# Patient Record
Sex: Female | Born: 1970 | Race: White | Hispanic: No | Marital: Married
Health system: Southern US, Community
[De-identification: ages and names within clinical notes are randomized; demographics above are authoritative.]

## PROBLEM LIST (undated history)

## (undated) DIAGNOSIS — I10 Essential (primary) hypertension: Secondary | ICD-10-CM

## (undated) DIAGNOSIS — E119 Type 2 diabetes mellitus without complications: Secondary | ICD-10-CM

---

## 1999-03-28 ENCOUNTER — Emergency Department (HOSPITAL_COMMUNITY): Admission: EM | Admit: 1999-03-28 | Discharge: 1999-03-28 | Payer: Self-pay | Admitting: Emergency Medicine

## 2001-03-02 ENCOUNTER — Encounter: Payer: Self-pay | Admitting: Family Medicine

## 2001-03-02 ENCOUNTER — Ambulatory Visit (HOSPITAL_COMMUNITY): Admission: RE | Admit: 2001-03-02 | Discharge: 2001-03-02 | Payer: Self-pay | Admitting: Family Medicine

## 2002-04-13 ENCOUNTER — Encounter: Payer: Self-pay | Admitting: Family Medicine

## 2002-04-13 ENCOUNTER — Encounter: Admission: RE | Admit: 2002-04-13 | Discharge: 2002-04-13 | Payer: Self-pay | Admitting: Family Medicine

## 2002-05-04 ENCOUNTER — Encounter: Payer: Self-pay | Admitting: General Surgery

## 2002-05-04 ENCOUNTER — Ambulatory Visit (HOSPITAL_COMMUNITY): Admission: RE | Admit: 2002-05-04 | Discharge: 2002-05-05 | Payer: Self-pay | Admitting: General Surgery

## 2002-12-07 ENCOUNTER — Other Ambulatory Visit: Admission: RE | Admit: 2002-12-07 | Discharge: 2002-12-07 | Payer: Self-pay | Admitting: Obstetrics and Gynecology

## 2002-12-11 ENCOUNTER — Encounter: Admission: RE | Admit: 2002-12-11 | Discharge: 2002-12-11 | Payer: Self-pay | Admitting: Obstetrics and Gynecology

## 2002-12-11 ENCOUNTER — Encounter: Payer: Self-pay | Admitting: Obstetrics and Gynecology

## 2002-12-15 ENCOUNTER — Encounter: Payer: Self-pay | Admitting: Obstetrics and Gynecology

## 2002-12-15 ENCOUNTER — Ambulatory Visit (HOSPITAL_COMMUNITY): Admission: RE | Admit: 2002-12-15 | Discharge: 2002-12-15 | Payer: Self-pay | Admitting: Obstetrics and Gynecology

## 2021-08-01 ENCOUNTER — Emergency Department (HOSPITAL_BASED_OUTPATIENT_CLINIC_OR_DEPARTMENT_OTHER)
Admission: EM | Admit: 2021-08-01 | Discharge: 2021-08-01 | Disposition: A | Discharge status: Home / Self Care | Payer: BC Managed Care – PPO | Attending: Emergency Medicine | Admitting: Emergency Medicine

## 2021-08-01 ENCOUNTER — Other Ambulatory Visit: Payer: Self-pay

## 2021-08-01 ENCOUNTER — Emergency Department (HOSPITAL_BASED_OUTPATIENT_CLINIC_OR_DEPARTMENT_OTHER): Payer: BC Managed Care – PPO

## 2021-08-01 ENCOUNTER — Encounter (HOSPITAL_BASED_OUTPATIENT_CLINIC_OR_DEPARTMENT_OTHER): Payer: Self-pay | Admitting: Emergency Medicine

## 2021-08-01 DIAGNOSIS — S93401A Sprain of unspecified ligament of right ankle, initial encounter: Secondary | ICD-10-CM

## 2021-08-01 DIAGNOSIS — I10 Essential (primary) hypertension: Secondary | ICD-10-CM | POA: Insufficient documentation

## 2021-08-01 DIAGNOSIS — E119 Type 2 diabetes mellitus without complications: Secondary | ICD-10-CM | POA: Diagnosis not present

## 2021-08-01 DIAGNOSIS — W010XXA Fall on same level from slipping, tripping and stumbling without subsequent striking against object, initial encounter: Secondary | ICD-10-CM | POA: Insufficient documentation

## 2021-08-01 DIAGNOSIS — S99911A Unspecified injury of right ankle, initial encounter: Secondary | ICD-10-CM | POA: Diagnosis present

## 2021-08-01 HISTORY — DX: Essential (primary) hypertension: I10

## 2021-08-01 HISTORY — DX: Type 2 diabetes mellitus without complications: E11.9

## 2021-08-01 NOTE — ED Notes (Signed)
PT placed in gown. Pt provided with ice to her left ankle.

## 2021-08-01 NOTE — ED Triage Notes (Signed)
Pt presents to ED POV. Pt c/o R ankle pain s/p mechanical fall. Pt reports that she slipped while trying to get into car. Landed on R knee. Pt denies head injury, denies blood thinners.

## 2021-08-01 NOTE — ED Provider Notes (Signed)
[facility] EMERGENCY DEPARTMENT Provider Note   CSN: [id number] Arrival date & time: 08/01/21  1509     History Chief Complaint  Patient presents with   Ankle Pain    Jenna Acevedo is a 50 y.o. female with medical history significant for hypertension and diabetes.  Patient states that today she was stepping out of her [facility] when she slipped on the step bar and had a mechanical fall to the ground.  Patient now complaining of right ankle pain.  Patient denies hitting her head or being on blood thinners.  Patient states that pain is consistent and describes it as a dull ache.  Patient has not attempted to relieve her symptoms utilizing any over-the-counter medication.  Patient states that when she tries to invert or evert her foot the pain is worsened.  Patient endorses right ankle pain.  Patient denies numbness, tingling to right ankle.   Ankle Pain     Past Medical History:  Diagnosis Date   Diabetes (HCC)    Hypertension     There are no problems to display for this patient.    OB History   No obstetric history on file.     No family history on file.     Home Medications Prior to Admission medications   Not on File    Allergies    Morphine and Amoxicillin  Review of Systems   Review of Systems  Musculoskeletal:  Positive for gait problem and myalgias.  Neurological:  Negative for weakness and numbness.  All other systems reviewed and are negative.  Physical Exam Updated Vital Signs BP (!) 149/100 (BP Location: Right Arm)    Pulse 75    Temp 97.9 F (36.6 C) (Oral)    Resp 18    Ht 5\' 5"  (1.651 m)    Wt 104.3 kg    SpO2 99%    BMI 38.27 kg/m   Physical Exam Vitals and nursing note reviewed.  Constitutional:      General: She is not in acute distress.    Appearance: She is not ill-appearing or toxic-appearing.  HENT:     Head: Normocephalic.  Eyes:     Pupils: Pupils are equal, round, and reactive to light.  Cardiovascular:      Rate and Rhythm: Normal rate and regular rhythm.     Pulses:          Dorsalis pedis pulses are 2+ on the right side and 2+ on the left side.  Pulmonary:     Effort: Pulmonary effort is normal.     Breath sounds: Normal breath sounds. No wheezing.  Abdominal:     General: Abdomen is flat.     Palpations: Abdomen is soft.     Tenderness: There is no abdominal tenderness.  Musculoskeletal:     Cervical back: Normal range of motion.     Right ankle: No swelling, deformity or ecchymosis. Tenderness present over the ATF ligament. Decreased range of motion (Decreased range of motion when patient actively tries to range ankle.).     Left ankle: Normal.     Comments: Right ankle lateral tenderness.  No swelling or ecchymosis.  No deformity.  Patient has 2+ palpable DP pulse.  Skin:    General: Skin is warm and dry.     Capillary Refill: Capillary refill takes less than 2 seconds.  Neurological:     General: No focal deficit present.     Mental Status: She is alert and oriented  to person, place, and time.    ED Results / Procedures / Treatments   Labs (all labs ordered are listed, but only abnormal results are displayed) Labs Reviewed - No data to display  EKG None  Radiology DG Ankle Complete Right  Result Date: 08/01/2021 CLINICAL DATA:  Right ankle pain after a fall, initial encounter. EXAM: RIGHT ANKLE - COMPLETE 3+ VIEW COMPARISON:  None. FINDINGS: No acute osseous or joint abnormality. Probable accessory ossicle along the posterior aspect of the ankle joint. Calcaneal spur. IMPRESSION: No acute osseous abnormality. Electronically Signed   By: Leanna Battles M.D.   On: 08/01/2021 16:03    Procedures Procedures   Medications Ordered in ED Medications - No data to display  ED Course  I have reviewed the triage vital signs and the nursing notes.  Pertinent labs & imaging results that were available during my care of the patient were reviewed by me and considered in my medical  decision making (see chart for details).    MDM Rules/Calculators/A&P                          50 year old female with history of hypertension diabetes presents status post mechanical fall to ground.  Patient complaining of right ankle pain.  Patient has full passive range of motion to right ankle.  No crepitus or deformity noted.  Patient states there is pain with inversion and eversion of ankle.  Patient has 5 out of 5 strength to right foot but complains of tenderness on palpation to lateral side of right ankle.  Patient ambulates with slight limp.  Ankle radiographs are unrevealing of any fractures or abnormalities.  Patient most likely has lateral ankle sprain.  I have educated this patient on supportive measures to alleviate her pain such as ice, heat, NSAIDs.  I will have the nurse apply an Ace wrap for support prior to discharge.  I have educated this patient and provided her with return precautions including numbness and tingling to her ankle or foot.  I have advised the patient if pain persist after 7 days to present to PCP for further management and work-up.  The patient had all of her questions answered.  She is in agreement with the plan for discharge.  The patient is stable on discharge  Final Clinical Impression(s) / ED Diagnoses Final diagnoses:  Sprain of right ankle, unspecified ligament, initial encounter    Rx / DC Orders ED Discharge Orders     None        Al Decant, PA-C 08/01/21 1756    Charlynne Pander, MD 08/01/21 2203

## 2021-08-01 NOTE — ED Notes (Signed)
Patient transported to X-ray 

## 2021-08-01 NOTE — Discharge Instructions (Addendum)
Return to ED with any new or worsening symptoms such as numbness, tingling to right ankle If pain persist in 7 days follow-up with your PCP for ongoing management Utilize the RICE method.  Rest, ice, compression, elevation. Utilize ice for the first 24 hours and heat after 24 hours for pain control You may take ibuprofen for pain and swelling

## 2021-08-01 NOTE — ED Notes (Signed)
Wrapped pt right ankle with ace wrap.

## 2022-12-01 ENCOUNTER — Encounter (INDEPENDENT_AMBULATORY_CARE_PROVIDER_SITE_OTHER): Payer: BC Managed Care – PPO | Admitting: Ophthalmology

## 2022-12-07 ENCOUNTER — Encounter (INDEPENDENT_AMBULATORY_CARE_PROVIDER_SITE_OTHER): Payer: BC Managed Care – PPO | Admitting: Ophthalmology

## 2022-12-07 DIAGNOSIS — H43813 Vitreous degeneration, bilateral: Secondary | ICD-10-CM

## 2022-12-07 DIAGNOSIS — H33302 Unspecified retinal break, left eye: Secondary | ICD-10-CM

## 2022-12-07 DIAGNOSIS — H35033 Hypertensive retinopathy, bilateral: Secondary | ICD-10-CM

## 2022-12-07 DIAGNOSIS — I10 Essential (primary) hypertension: Secondary | ICD-10-CM

## 2022-12-10 ENCOUNTER — Encounter (INDEPENDENT_AMBULATORY_CARE_PROVIDER_SITE_OTHER): Payer: BC Managed Care – PPO | Admitting: Ophthalmology

## 2022-12-10 DIAGNOSIS — H33302 Unspecified retinal break, left eye: Secondary | ICD-10-CM

## 2022-12-24 ENCOUNTER — Encounter (INDEPENDENT_AMBULATORY_CARE_PROVIDER_SITE_OTHER): Payer: BC Managed Care – PPO | Admitting: Ophthalmology

## 2022-12-24 DIAGNOSIS — H33302 Unspecified retinal break, left eye: Secondary | ICD-10-CM

## 2023-04-29 ENCOUNTER — Encounter (INDEPENDENT_AMBULATORY_CARE_PROVIDER_SITE_OTHER): Payer: BC Managed Care – PPO | Admitting: Ophthalmology

## 2023-06-12 IMAGING — DX DG ANKLE COMPLETE 3+V*R*
3 series · 3 of 3 positions shown · non-contrast
Comparison: None.

CLINICAL DATA: Right ankle pain after a fall, initial encounter.

EXAM:
RIGHT ANKLE - COMPLETE 3+ VIEW

[ankle ap]
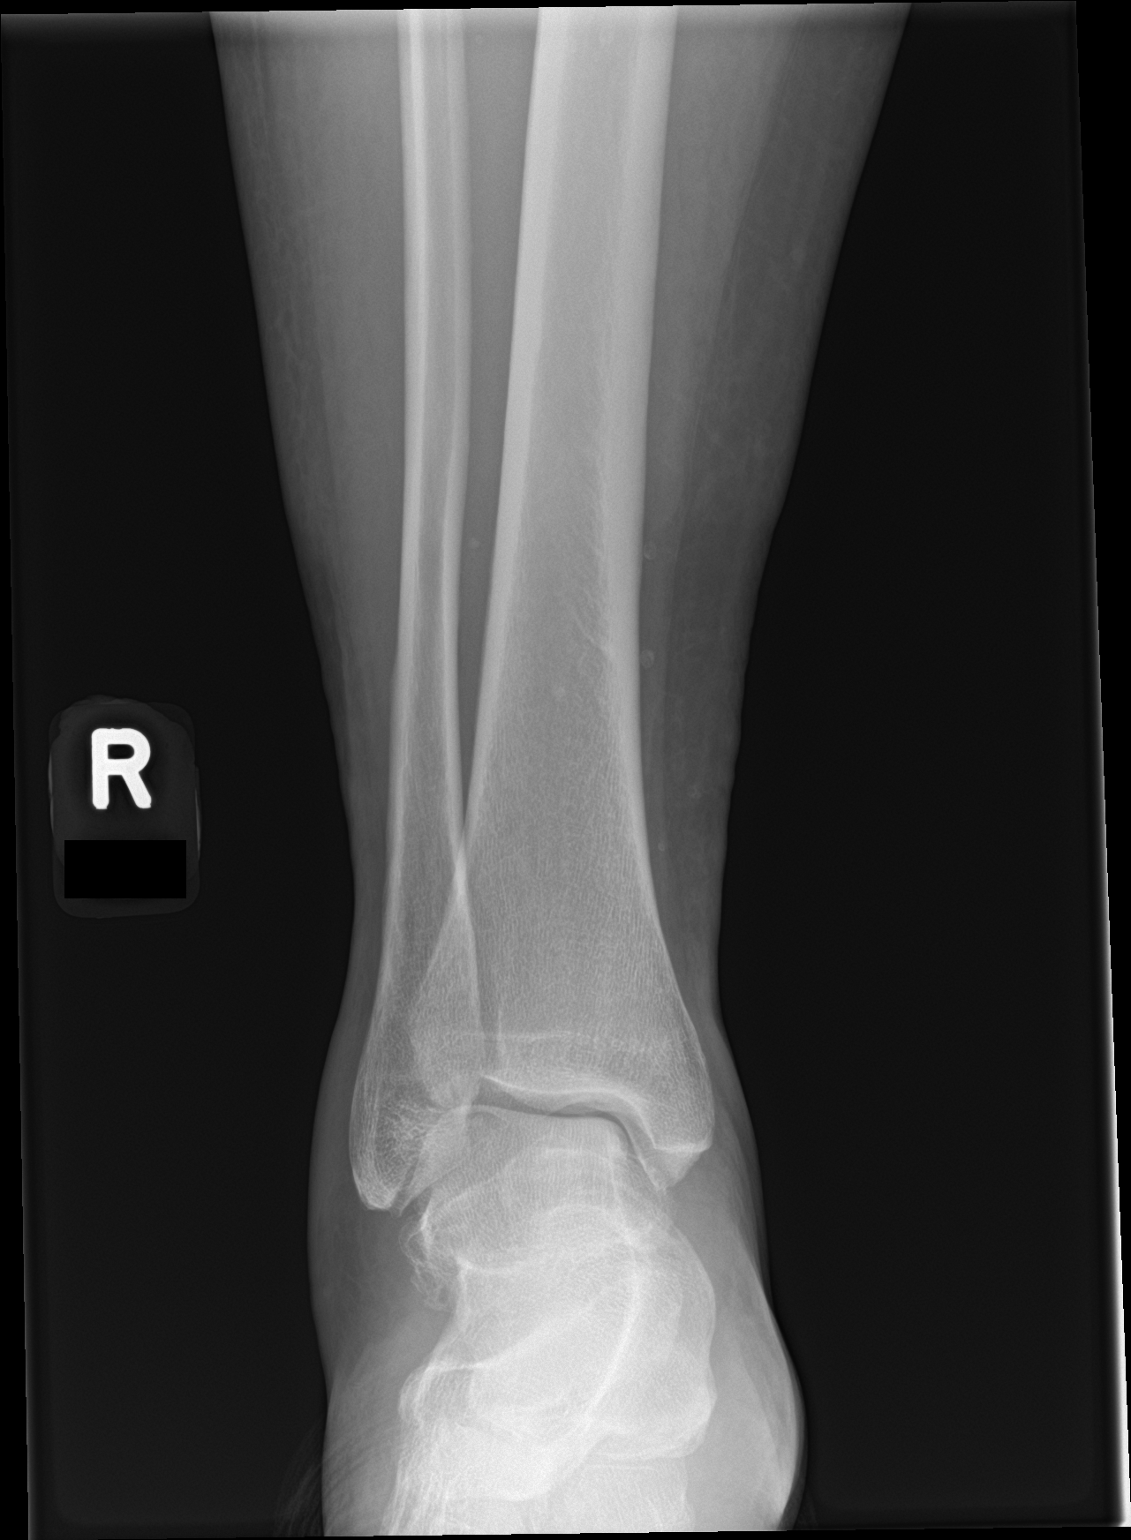

[ankle obl]
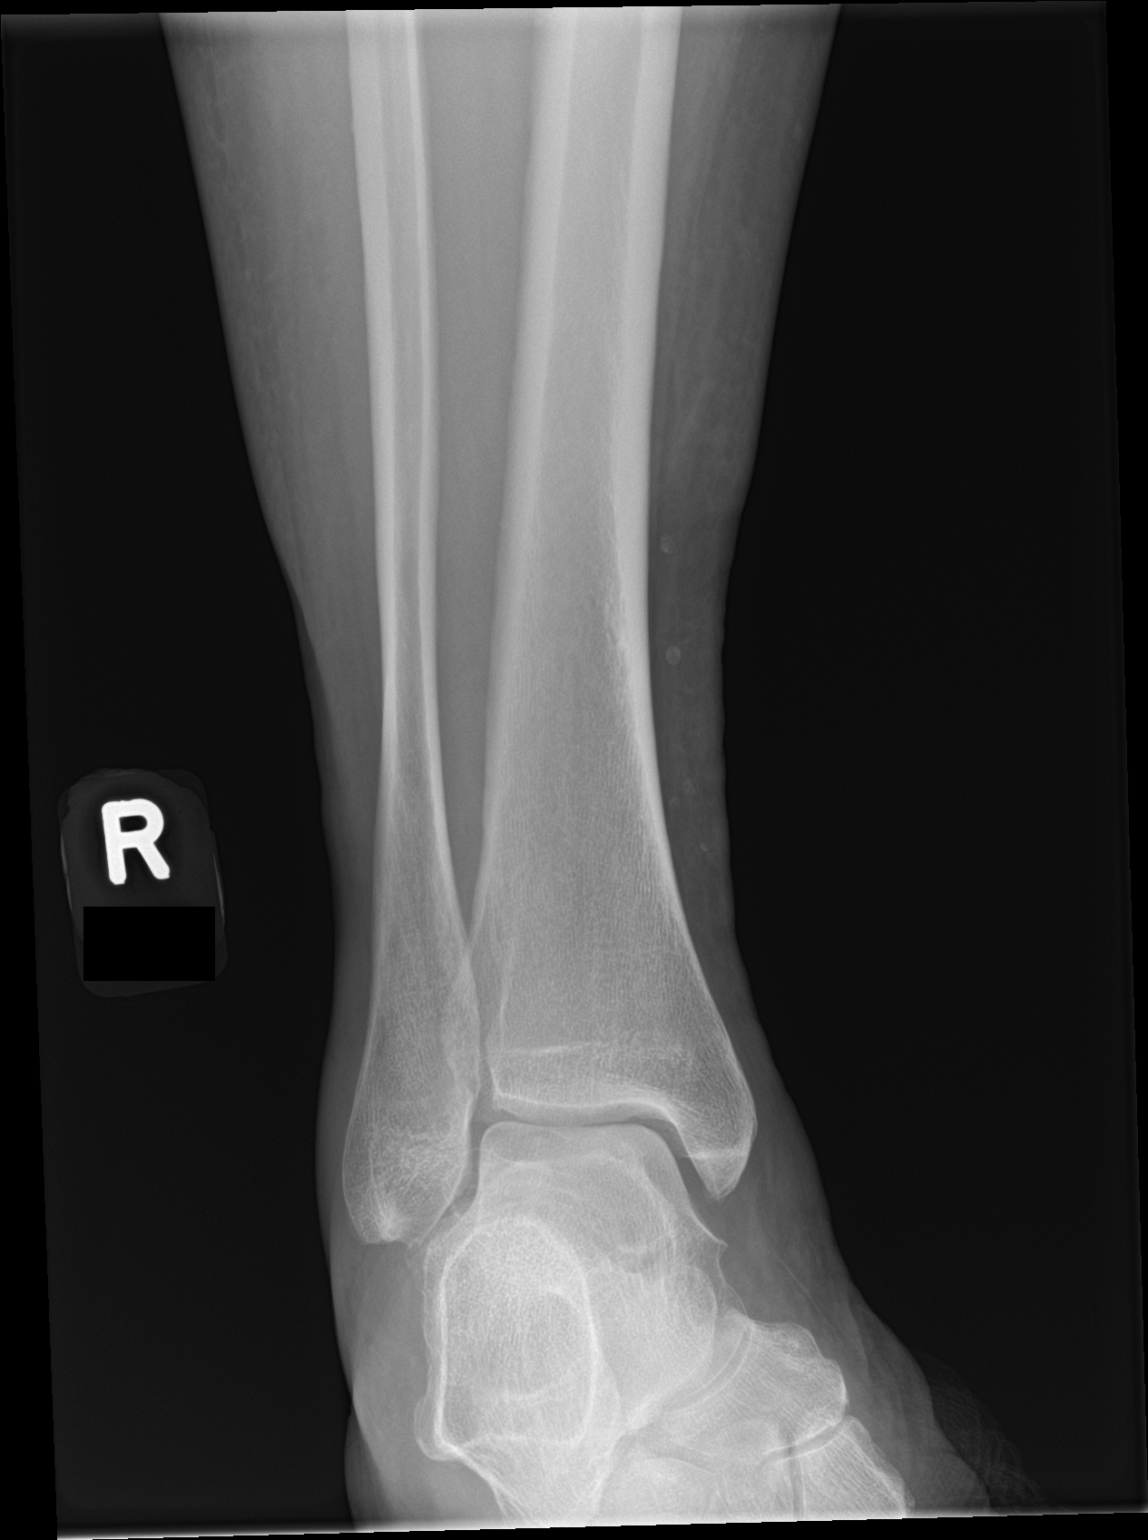

[ankle lat]
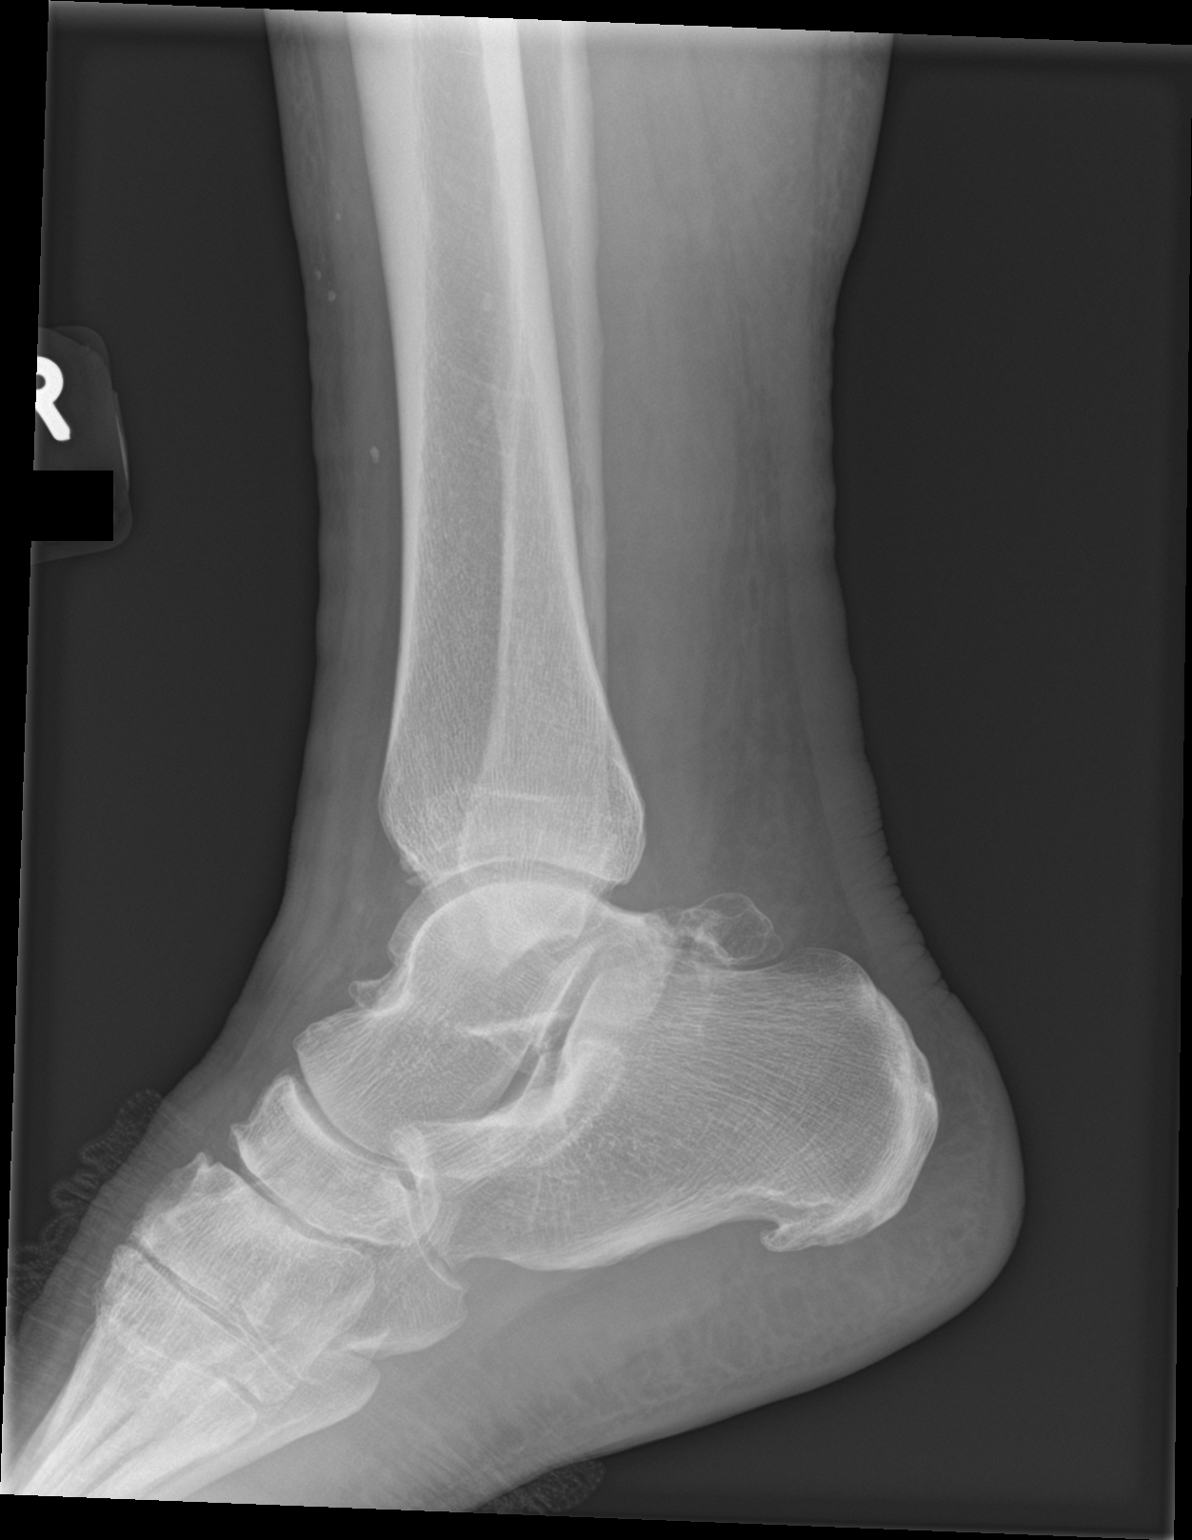

[3 of 3 positions shown; findings below may reference images not displayed]

FINDINGS: No acute osseous or joint abnormality. Probable accessory ossicle
along the posterior aspect of the ankle joint. Calcaneal spur.
IMPRESSION: No acute osseous abnormality.
